# Patient Record
Sex: Male | Born: 1950 | Race: White | Hispanic: No
Health system: Southern US, Community
[De-identification: ages and names within clinical notes are randomized; demographics above are authoritative.]

## PROBLEM LIST (undated history)

## (undated) DIAGNOSIS — N200 Calculus of kidney: Secondary | ICD-10-CM

## (undated) HISTORY — DX: Calculus of kidney: N20.0

---

## 2018-07-26 ENCOUNTER — Other Ambulatory Visit: Payer: Self-pay | Admitting: Family Medicine

## 2018-07-26 DIAGNOSIS — R748 Abnormal levels of other serum enzymes: Secondary | ICD-10-CM

## 2018-07-30 ENCOUNTER — Ambulatory Visit
Admission: RE | Admit: 2018-07-30 | Discharge: 2018-07-30 | Disposition: A | Discharge status: Home / Self Care | Payer: Medicare HMO | Source: Ambulatory Visit | Attending: Family Medicine | Admitting: Family Medicine

## 2018-07-30 DIAGNOSIS — R748 Abnormal levels of other serum enzymes: Secondary | ICD-10-CM | POA: Diagnosis present

## 2019-11-13 ENCOUNTER — Ambulatory Visit: Payer: Medicare HMO | Attending: Internal Medicine

## 2019-11-13 DIAGNOSIS — Z20822 Contact with and (suspected) exposure to covid-19: Secondary | ICD-10-CM

## 2019-11-14 LAB — NOVEL CORONAVIRUS, NAA: SARS-CoV-2, NAA: DETECTED — AB

## 2019-11-15 ENCOUNTER — Telehealth: Payer: Self-pay | Admitting: Unknown Physician Specialty

## 2019-11-15 NOTE — Telephone Encounter (Signed)
Discussed with patient about Covid symptoms and the use of bamlanivimab, a monoclonal antibody infusion for those with mild to moderate Covid symptoms and at a high risk of hospitalization.  Pt is qualified for this infusion at the [facility] infusion center due to Age > 65   Pt states he is getting progressively better and not interested in mab infusion.

## 2020-01-12 ENCOUNTER — Ambulatory Visit: Payer: Medicare HMO | Attending: Internal Medicine

## 2020-01-12 DIAGNOSIS — Z23 Encounter for immunization: Secondary | ICD-10-CM

## 2020-01-12 NOTE — Progress Notes (Signed)
   Covid-19 Vaccination Clinic  Name:  Stephen Hanson    MRN: [id number] DOB: [birth year]  01/12/2020  [name] was observed post Covid-19 immunization for 15 minutes without incident. He was provided with Vaccine Information Sheet and instruction to access the V-Safe system.   [name] was instructed to call 911 with any severe reactions post vaccine: Marland Kitchen Difficulty breathing  . Swelling of face and throat  . A fast heartbeat  . A bad rash all over body  . Dizziness and weakness   Immunizations Administered    Name Date Dose VIS Date Route   Pfizer COVID-19 Vaccine 01/12/2020 11:36 AM 0.3 mL 09/26/2019 Intramuscular   Manufacturer: [employer], [employer]   Lot: [id number]   NDC: [address]-8

## 2020-02-02 ENCOUNTER — Ambulatory Visit: Payer: Medicare HMO | Attending: Internal Medicine

## 2020-02-02 DIAGNOSIS — Z23 Encounter for immunization: Secondary | ICD-10-CM

## 2020-02-02 NOTE — Progress Notes (Signed)
   Covid-19 Vaccination Clinic  Name:  Stephen Hanson    MRN: [id number] DOB: 08-12-1951  02/02/2020  [name] was observed post Covid-19 immunization for 15 minutes without incident. He was provided with Vaccine Information Sheet and instruction to access the V-Safe system.   [name] was instructed to call 911 with any severe reactions post vaccine: Marland Kitchen Difficulty breathing  . Swelling of face and throat  . A fast heartbeat  . A bad rash all over body  . Dizziness and weakness   Immunizations Administered    Name Date Dose VIS Date Route   Pfizer COVID-19 Vaccine 02/02/2020 10:53 AM 0.3 mL 12/10/2018 Intramuscular   Manufacturer: [employer], [employer]   Lot: [id number]   NDC: [address]-5

## 2020-10-12 IMAGING — US US ABDOMEN LIMITED
1 series · 14 of 25 positions shown · non-contrast
Comparison: None.

CLINICAL DATA: 67-year-old male with a history elevated liver
enzymes

EXAM:
ULTRASOUND ABDOMEN LIMITED RIGHT UPPER QUADRANT

[Series 1: us abdomen limited · 0.20mm/px · 14 of 66 slices shown]
[im 1/66]
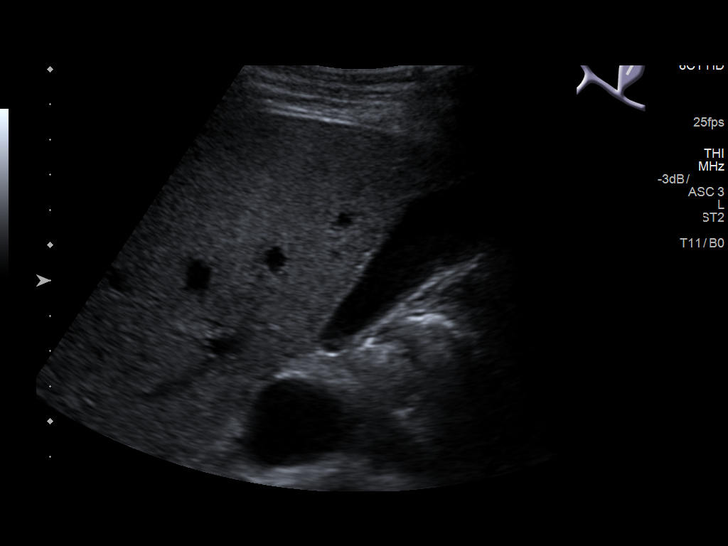
[im 6/66]
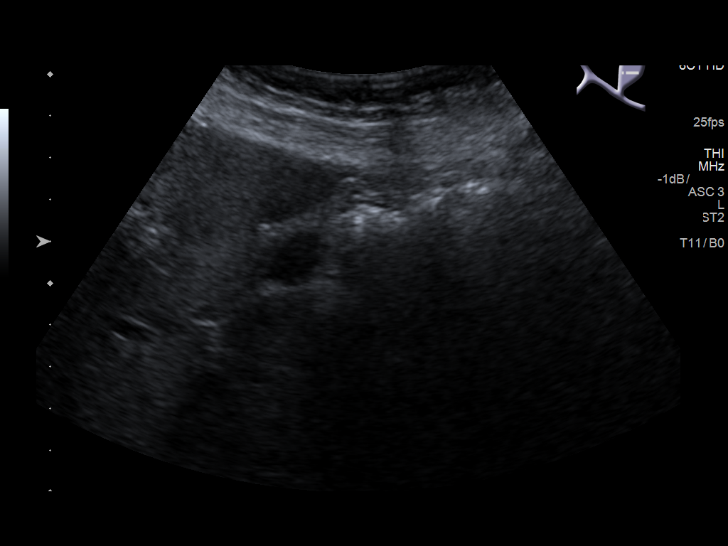
[im 11/66]
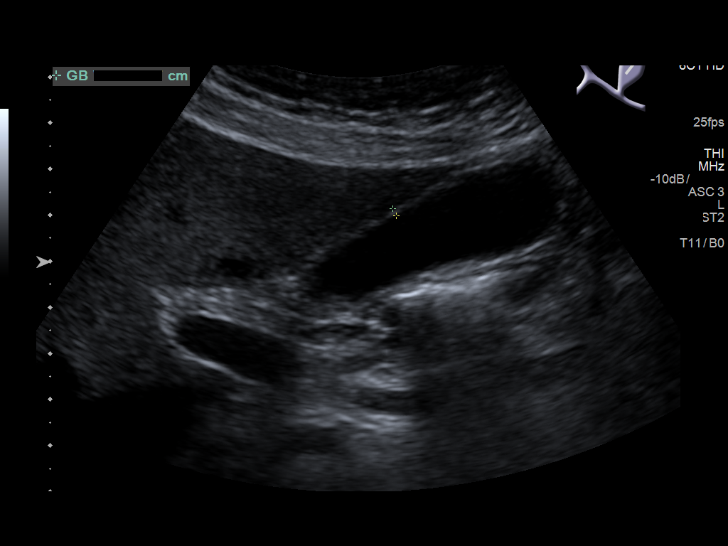
[im 17/66]
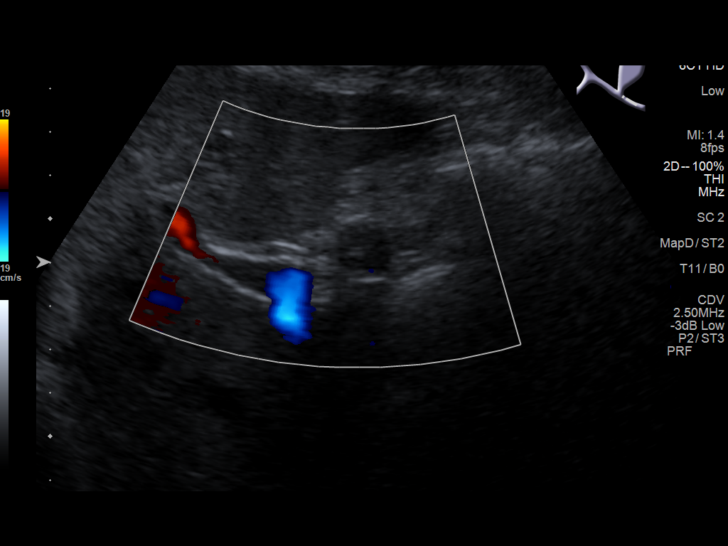
[im 22/66]
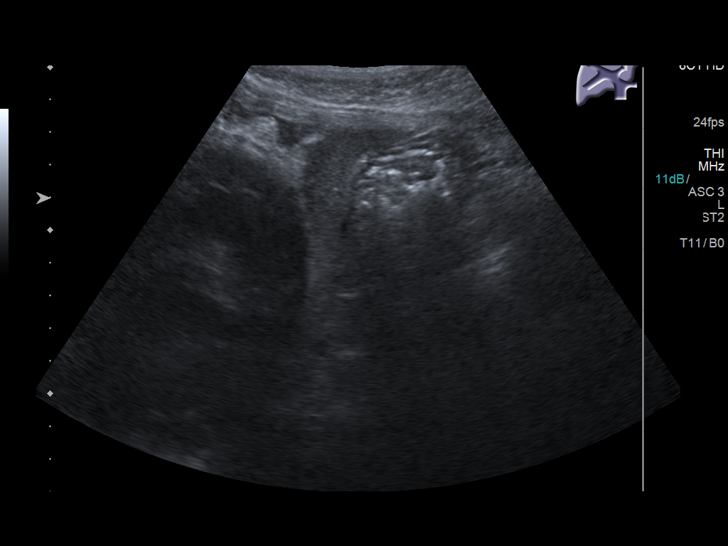
[im 25/66]
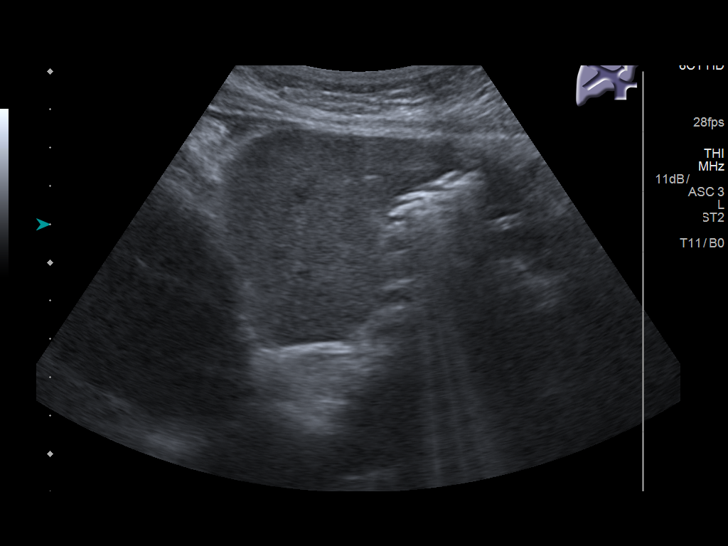
[im 30/66]
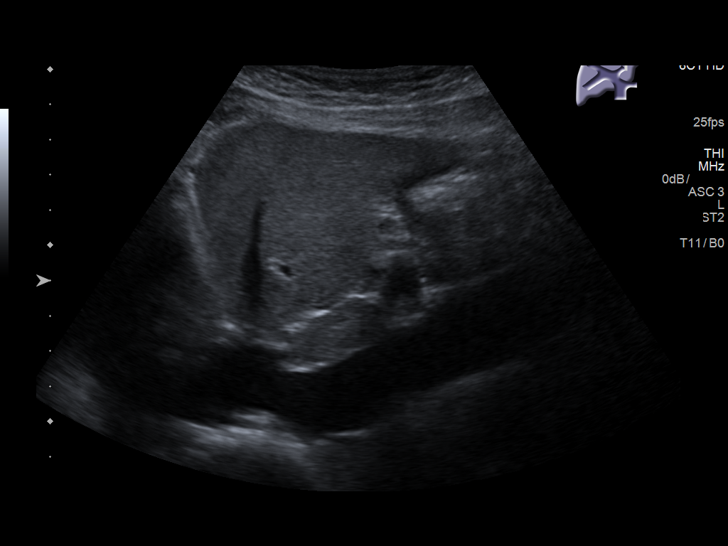
[im 36/66]
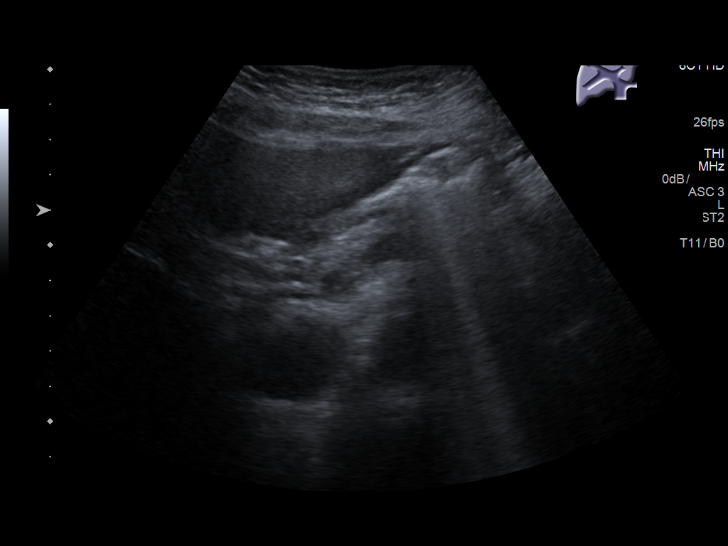
[im 41/66]
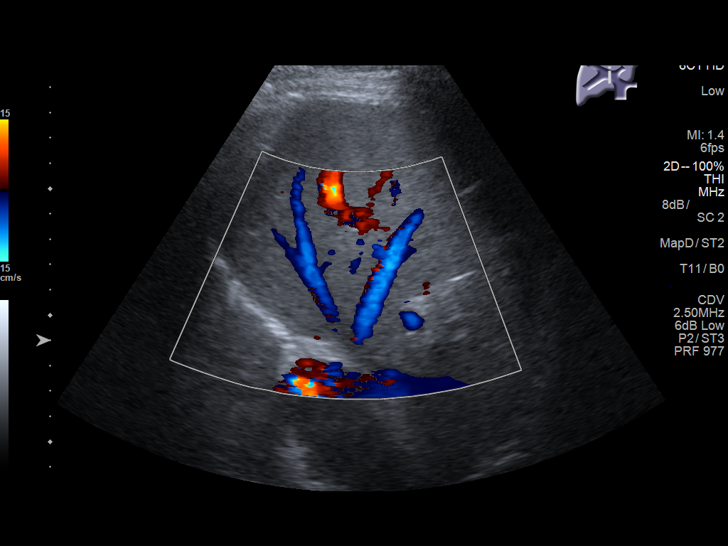
[im 44/66]
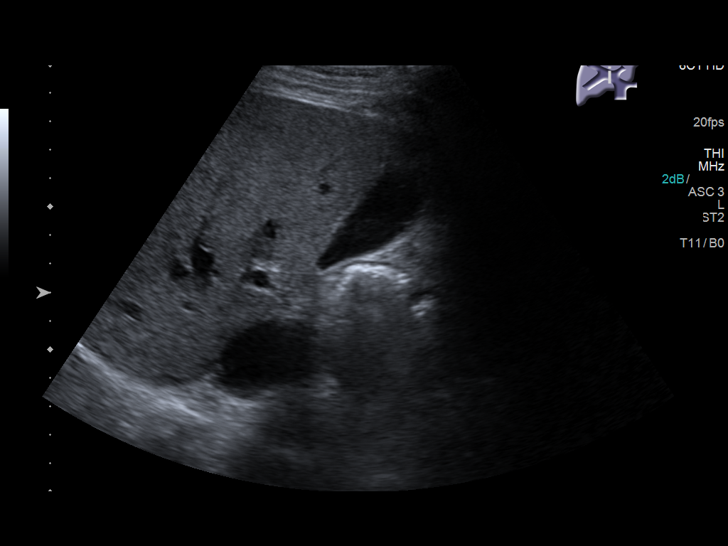
[im 49/66]
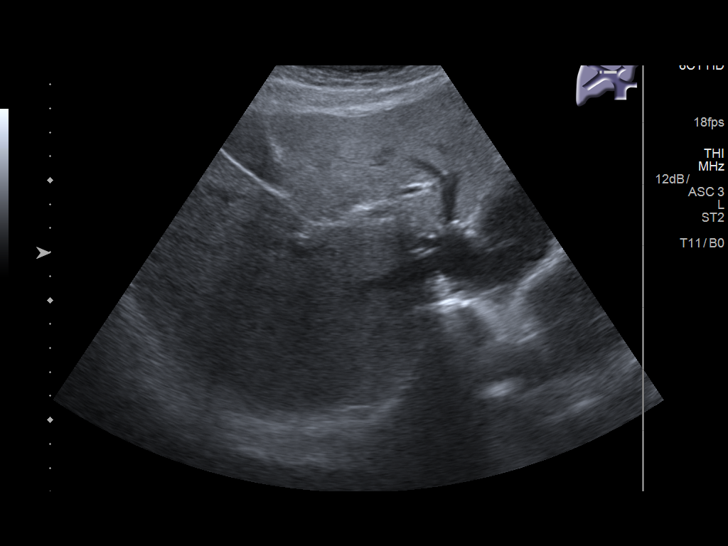
[im 55/66]
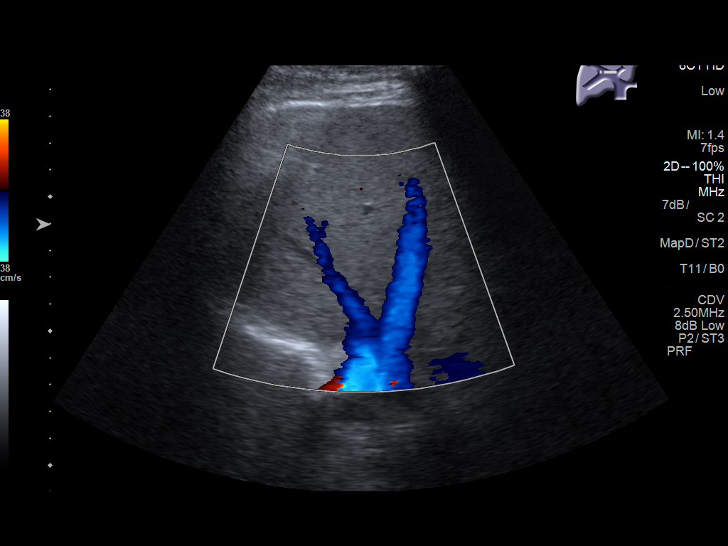
[im 60/66]
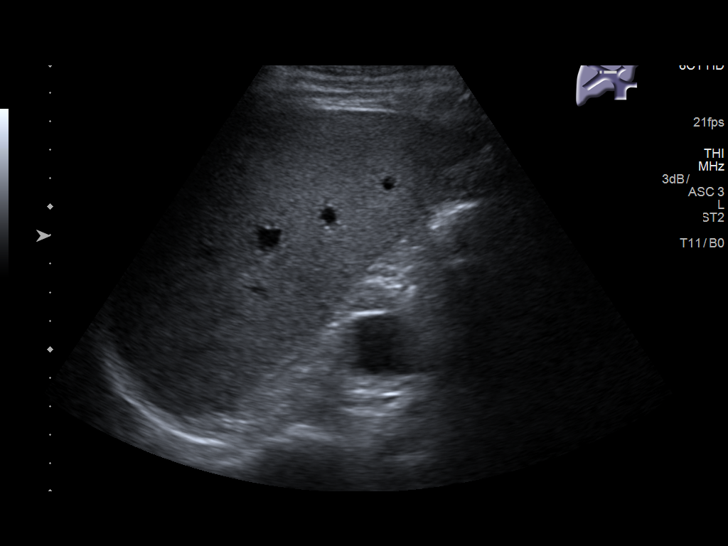
[im 66/66]
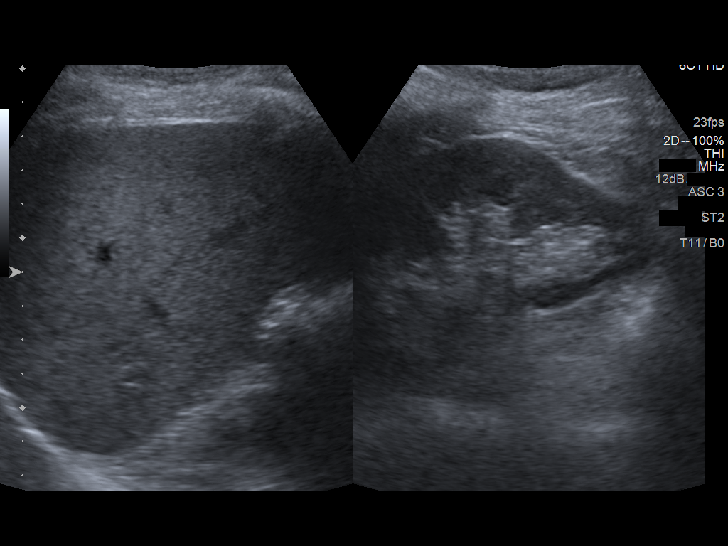

[14 of 25 positions shown; findings below may reference images not displayed]

FINDINGS: Gallbladder:

No gallstones or wall thickening visualized. No sonographic Murphy
sign noted by sonographer.

Common bile duct:

Diameter: 5 mm

Liver:

Mildly increased echogenicity of liver parenchyma. Portal vein is
patent on color Doppler imaging with normal direction of blood flow
towards the liver.
IMPRESSION: No evidence cholelithiasis.

Mildly increased echogenicity of liver parenchyma, may reflect
medical liver disease.

## 2023-12-14 ENCOUNTER — Emergency Department
Admission: EM | Admit: 2023-12-14 | Discharge: 2023-12-14 | Disposition: A | Discharge status: Home / Self Care | Payer: Medicare HMO | Attending: Emergency Medicine | Admitting: Emergency Medicine

## 2023-12-14 ENCOUNTER — Emergency Department: Payer: Medicare HMO

## 2023-12-14 ENCOUNTER — Other Ambulatory Visit: Payer: Self-pay

## 2023-12-14 DIAGNOSIS — D72829 Elevated white blood cell count, unspecified: Secondary | ICD-10-CM | POA: Diagnosis not present

## 2023-12-14 DIAGNOSIS — N132 Hydronephrosis with renal and ureteral calculous obstruction: Secondary | ICD-10-CM | POA: Diagnosis not present

## 2023-12-14 DIAGNOSIS — R109 Unspecified abdominal pain: Secondary | ICD-10-CM | POA: Diagnosis present

## 2023-12-14 DIAGNOSIS — I10 Essential (primary) hypertension: Secondary | ICD-10-CM | POA: Diagnosis not present

## 2023-12-14 DIAGNOSIS — N200 Calculus of kidney: Secondary | ICD-10-CM

## 2023-12-14 LAB — CBC
HCT: 44.9 % (ref 39.0–52.0)
Hemoglobin: 15.9 g/dL (ref 13.0–17.0)
MCH: 32.3 pg (ref 26.0–34.0)
MCHC: 35.4 g/dL (ref 30.0–36.0)
MCV: 91.1 fL (ref 80.0–100.0)
Platelets: 168 10*3/uL (ref 150–400)
RBC: 4.93 MIL/uL (ref 4.22–5.81)
RDW: 12.5 % (ref 11.5–15.5)
WBC: 15.3 10*3/uL — ABNORMAL HIGH (ref 4.0–10.5)
nRBC: 0 % (ref 0.0–0.2)

## 2023-12-14 LAB — COMPREHENSIVE METABOLIC PANEL
ALT: 22 U/L (ref 0–44)
AST: 28 U/L (ref 15–41)
Albumin: 4.4 g/dL (ref 3.5–5.0)
Alkaline Phosphatase: 66 U/L (ref 38–126)
Anion gap: 12 (ref 5–15)
BUN: 16 mg/dL (ref 8–23)
CO2: 23 mmol/L (ref 22–32)
Calcium: 9.5 mg/dL (ref 8.9–10.3)
Chloride: 101 mmol/L (ref 98–111)
Creatinine, Ser: 0.84 mg/dL (ref 0.61–1.24)
GFR, Estimated: >60 mL/min (ref >60)
Glucose, Bld: 127 mg/dL — ABNORMAL HIGH (ref 70–99)
Potassium: 3.9 mmol/L (ref 3.5–5.1)
Sodium: 136 mmol/L (ref 135–145)
Total Bilirubin: 0.8 mg/dL (ref 0.0–1.2)
Total Protein: 7.7 g/dL (ref 6.5–8.1)

## 2023-12-14 LAB — URINALYSIS, ROUTINE W REFLEX MICROSCOPIC
Bacteria, UA: NONE SEEN
Bilirubin Urine: NEGATIVE
Glucose, UA: NEGATIVE mg/dL
Ketones, ur: 5 mg/dL — AB
Leukocytes,Ua: NEGATIVE
Nitrite: NEGATIVE
Protein, ur: 30 mg/dL — AB
RBC / HPF: >50 RBC/hpf (ref 0–5)
Specific Gravity, Urine: 1.018 (ref 1.005–1.030)
Squamous Epithelial / HPF: 0 /HPF (ref 0–5)
pH: 7 (ref 5.0–8.0)

## 2023-12-14 MED ORDER — SODIUM CHLORIDE 0.9 % IV BOLUS
1000.0000 mL | Freq: Once | INTRAVENOUS | Status: AC
  Administered 2023-12-14: 1000 mL via INTRAVENOUS

## 2023-12-14 MED ORDER — IBUPROFEN 600 MG PO TABS: 600.0000 mg | ORAL_TABLET | Freq: Four times a day (QID) | ORAL | 0 refills | Status: AC | PRN

## 2023-12-14 MED ORDER — TAMSULOSIN HCL 0.4 MG PO CAPS: 0.4000 mg | ORAL_CAPSULE | Freq: Every day | ORAL | 0 refills | Status: AC

## 2023-12-14 MED ORDER — OXYCODONE-ACETAMINOPHEN 5-325 MG PO TABS: 1.0000 | ORAL_TABLET | ORAL | 0 refills | Status: AC | PRN

## 2023-12-14 MED ORDER — HYDROMORPHONE HCL 1 MG/ML IJ SOLN
0.5000 mg | Freq: Once | INTRAMUSCULAR | Status: AC
  Administered 2023-12-14: 0.5 mg via INTRAVENOUS
  Filled 2023-12-14: qty 0.5

## 2023-12-14 MED ORDER — KETOROLAC TROMETHAMINE 30 MG/ML IJ SOLN
15.0000 mg | Freq: Once | INTRAMUSCULAR | Status: AC
  Administered 2023-12-14: 15 mg via INTRAVENOUS
  Filled 2023-12-14: qty 1

## 2023-12-14 MED ORDER — MORPHINE SULFATE (PF) 4 MG/ML IV SOLN
4.0000 mg | Freq: Once | INTRAVENOUS | Status: AC
  Administered 2023-12-14: 4 mg via INTRAVENOUS
  Filled 2023-12-14: qty 1

## 2023-12-14 MED ORDER — ONDANSETRON HCL 4 MG/2ML IJ SOLN
4.0000 mg | Freq: Once | INTRAMUSCULAR | Status: AC
Start: 2023-12-14 — End: 2023-12-14
  Administered 2023-12-14: 4 mg via INTRAVENOUS
  Filled 2023-12-14: qty 2

## 2023-12-14 NOTE — Discharge Instructions (Addendum)
 Take the ibuprofen up to every 6 hours and the Percocet only if needed for breakthrough pain.  Take the Flomax daily to help the stone pass.  Make sure to drink plenty of fluids.  We have given you referral to a urologist for follow-up in case you continue have pain and the stone does not pass.  Return to the emergency department for new, worsening, or persistent severe pain, vomiting, fever, weakness, or any other new or worsening symptoms that concern you.

## 2023-12-14 NOTE — ED Provider Notes (Signed)
 [facility] Provider Note    Event Date/Time   First MD Initiated Contact with Patient 12/14/23 1611     (approximate)   History   Flank Pain   HPI  Stephen Hanson is a 73 y.o. male with a history of hypertension, IBS, and GERD who presents with right flank pain, acute onset a few hours ago, waxing and waning in intensity but constant since that time.  It is associated with nausea and dry heaving.  He denies any urinary symptoms or diarrhea.  He states the pain is radiating around to the right side of his abdomen.  I reviewed the past medical records.  The patient's most recent outpatient encounter was with internal medicine on 10/29 for routine follow-up.  He has no recent ED visits or hospitalizations.   Physical Exam   Triage Vital Signs: ED Triage Vitals  Encounter Vitals Group     BP 12/14/23 1508 (!) 137/99     Systolic BP Percentile --      Diastolic BP Percentile --      Pulse Rate 12/14/23 1508 (!) 55     Resp 12/14/23 1508 16     Temp 12/14/23 1508 (!) 97.5 F (36.4 C)     Temp Source 12/14/23 1508 Oral     SpO2 12/14/23 1508 100 %     Weight 12/14/23 1506 175 lb (79.4 kg)     Height --      Head Circumference --      Peak Flow --      Pain Score 12/14/23 1506 10     Pain Loc --      Pain Education --      Exclude from Growth Chart --     Most recent vital signs: Vitals:   12/14/23 1830 12/14/23 1900  BP: (!) 163/71 126/78  Pulse: (!) 54 (!) 56  Resp: 18 17  Temp:    SpO2: 94% 94%     General: Awake, no distress.  CV:  Good peripheral perfusion.  Resp:  Normal effort.  Abd:  No distention.  Soft and nontender. Other:  Mild right CVA tenderness.   ED Results / Procedures / Treatments   Labs (all labs ordered are listed, but only abnormal results are displayed) Labs Reviewed  CBC - Abnormal; Notable for the following components:      Result Value   WBC 15.3 (*)    All other components within normal limits   COMPREHENSIVE METABOLIC PANEL - Abnormal; Notable for the following components:   Glucose, Bld 127 (*)    All other components within normal limits  URINALYSIS, ROUTINE W REFLEX MICROSCOPIC - Abnormal; Notable for the following components:   Color, Urine YELLOW (*)    APPearance HAZY (*)    Hgb urine dipstick LARGE (*)    Ketones, ur 5 (*)    Protein, ur 30 (*)    All other components within normal limits     EKG     RADIOLOGY  CT abdomen/pelvis: I independently viewed and interpreted the images; there is a small right ureteral stone with no significant hydronephrosis.  Radiology report indicates the following:  IMPRESSION:  1. There is a 3 mm stone within the mid right ureter with minimal  right hydronephrosis.  2. Dilated calyx in the mid to lower pole of the left kidney  containing 4 adjacent stones measuring up to 6 mm in size. No  left-sided hydronephrosis.  3. Colonic diverticulosis without evidence  of acute diverticulitis.  4. Mild prostatomegaly.  5. Aortic and coronary artery atherosclerosis (ICD10-I70.0).    PROCEDURES:  Critical Care performed: No  Procedures   MEDICATIONS ORDERED IN ED: Medications  ondansetron (ZOFRAN) injection 4 mg (4 mg Intravenous Given 12/14/23 1518)  morphine (PF) 4 MG/ML injection 4 mg (4 mg Intravenous Given 12/14/23 1518)  sodium chloride 0.9 % bolus 1,000 mL (0 mLs Intravenous Stopped 12/14/23 1725)  HYDROmorphone (DILAUDID) injection 0.5 mg (0.5 mg Intravenous Given 12/14/23 1646)  ketorolac (TORADOL) 30 MG/ML injection 15 mg (15 mg Intravenous Given 12/14/23 1647)     IMPRESSION / MDM / ASSESSMENT AND PLAN / ED COURSE  I reviewed the triage vital signs and the nursing notes.  73 year old male with PMH as noted above presents with acute onset of right flank pain since earlier today associated with nausea and vomiting but now with any diarrhea or urinary symptoms.  On exam the vital signs are normal.  The patient has mild right  CVA tenderness but the abdomen is soft and nontender.  Differential diagnosis includes, but is not limited to, ureteral stone, musculoskeletal pain, UTI/pyelonephritis, colitis, diverticulitis.  We will obtain basic labs, CT, give fluids, analgesia, and reassess.  Patient's presentation is most consistent with acute complicated illness / injury requiring diagnostic workup.  ----------------------------------------- 7:06 PM on 12/14/2023 -----------------------------------------  CT shows a small right ureteral stone with no significant hydronephrosis.  BMP and CBC are unremarkable except for mild leukocytosis.  Creatinine is normal.  Urinalysis shows some WBCs but mostly RBCs.  There are no bacteria, leukocyte esterase, or nitrites to suggest UTI.  The patient's pain is well-controlled.  He feels comfortable and is stable for discharge home.  I counseled him on the results of the workup.  I have given urology referral.  I gave strict return precautions and he expresses understanding.   FINAL CLINICAL IMPRESSION(S) / ED DIAGNOSES   Final diagnoses:  Kidney stone     Rx / DC Orders   ED Discharge Orders          Ordered    oxyCODONE-acetaminophen (PERCOCET) 5-325 MG tablet  Every 4 hours PRN        12/14/23 1905    ibuprofen (ADVIL) 600 MG tablet  Every 6 hours PRN        12/14/23 1905    tamsulosin (FLOMAX) 0.4 MG CAPS capsule  Daily after supper        12/14/23 1905             Note:  This document was prepared using Dragon voice recognition software and may include unintentional dictation errors.    Dionne Bucy, MD 12/14/23 [phone]

## 2023-12-14 NOTE — ED Triage Notes (Signed)
 C/O right lower back / flank pain since noon.  Also some Vomiting.

## 2023-12-25 ENCOUNTER — Ambulatory Visit: Admitting: Urology

## 2023-12-25 ENCOUNTER — Encounter: Payer: Self-pay | Admitting: Urology

## 2023-12-25 VITALS — BP 132/76 | HR 60 | Ht 63.0 in | Wt 175.0 lb

## 2023-12-25 DIAGNOSIS — N200 Calculus of kidney: Secondary | ICD-10-CM

## 2023-12-25 DIAGNOSIS — Z87442 Personal history of urinary calculi: Secondary | ICD-10-CM

## 2023-12-25 DIAGNOSIS — Z125 Encounter for screening for malignant neoplasm of prostate: Secondary | ICD-10-CM | POA: Diagnosis not present

## 2023-12-25 NOTE — Progress Notes (Signed)
   12/25/23 10:06 AM   Stephen Hanson [birth year] [id number]  CC: Nephrolithiasis, PSA screening  HPI: 73 year old male recently seen in the ER for right-sided flank pain on 12/14/2023 with CT showing a 3 mm right distal ureteral stone, nonobstructive left renal stones.  He saw the stone pass a few days later and denies any symptoms today.  He was reportedly told in the 80s that he had nonobstructive left-sided kidney stones.  PSA from October 2024 normal at 2.75 and stable from prior values.   PMH: Past Medical History:  Diagnosis Date   Kidney stone    Physical Exam: BP 132/76 (BP Location: Left Arm, Patient Position: Sitting, Cuff Size: Normal)   Pulse 60   Ht 5\' 3"  (1.6 m)   Wt 175 lb (79.4 kg)   SpO2 97%   BMI 31.00 kg/m    Constitutional:  Alert and oriented, No acute distress. Cardiovascular: No clubbing, cyanosis, or edema. Respiratory: Normal respiratory effort, no increased work of breathing. GI: Abdomen is soft, nontender, nondistended, no abdominal masses   Pertinent Imaging: I have personally viewed and interpreted the CT scan showing nonobstructive left renal stones, no left-sided hydronephrosis, 3 mm right distal ureteral stone.  Assessment & Plan:   73 year old male with recent spontaneously passed 3 mm right mid ureteral stone, nonobstructive left renal stones also seen on CT.  He thinks he was told in the 80s that he had nonobstructive left renal stones at that time and would like to continue with surveillance.  We discussed general stone prevention strategies including adequate hydration with goal of producing 2.5 L of urine daily, increasing citric acid intake, increasing calcium intake during high oxalate meals, minimizing animal protein, and decreasing salt intake. Information about dietary recommendations given today.   We also reviewed the risks and benefits of PSA screening, with his excellent health, not unreasonable to continue PSA screening through age  89.  RTC 1 year KUB for surveillance of nonobstructive left renal stones, if stable can likely follow-up as needed  Legrand Rams, MD 12/25/2023  [facility] [address] [place], [place] [address] [phone]

## 2023-12-25 NOTE — Patient Instructions (Signed)

## 2024-06-12 ENCOUNTER — Encounter: Payer: Self-pay | Admitting: Urology

## 2024-10-02 ENCOUNTER — Encounter: Payer: Self-pay | Admitting: Urology

## 2024-12-24 ENCOUNTER — Ambulatory Visit: Admitting: Urology

## 2024-12-25 ENCOUNTER — Ambulatory Visit: Admitting: Urology
